# Patient Record
Sex: Female | Born: 2000 | Race: Black or African American | Hispanic: No | Marital: Single | Smoking: Never smoker
Health system: Southern US, Community
[De-identification: ages and names within clinical notes are randomized; demographics above are authoritative.]

## PROBLEM LIST (undated history)

## (undated) DIAGNOSIS — I10 Essential (primary) hypertension: Secondary | ICD-10-CM

## (undated) HISTORY — PX: HERNIA REPAIR: SHX51

---

## 2018-12-18 ENCOUNTER — Encounter (HOSPITAL_COMMUNITY): Payer: Self-pay | Admitting: Emergency Medicine

## 2018-12-18 ENCOUNTER — Emergency Department (HOSPITAL_COMMUNITY)
Admission: EM | Admit: 2018-12-18 | Discharge: 2018-12-18 | Disposition: A | Discharge status: Home / Self Care | Payer: Managed Care, Other (non HMO) | Attending: Emergency Medicine | Admitting: Emergency Medicine

## 2018-12-18 DIAGNOSIS — L509 Urticaria, unspecified: Secondary | ICD-10-CM | POA: Insufficient documentation

## 2018-12-18 DIAGNOSIS — I10 Essential (primary) hypertension: Secondary | ICD-10-CM | POA: Insufficient documentation

## 2018-12-18 HISTORY — DX: Essential (primary) hypertension: I10

## 2018-12-18 MED ORDER — DIPHENHYDRAMINE HCL 25 MG PO CAPS
25.0000 mg | ORAL_CAPSULE | Freq: Once | ORAL | Status: AC
  Administered 2018-12-18: 25 mg via ORAL
  Filled 2018-12-18: qty 1

## 2018-12-18 NOTE — Discharge Instructions (Addendum)
Take Benadryl 1 tablet every 8 hours for 3 days.

## 2018-12-18 NOTE — ED Triage Notes (Signed)
Pt presents with hives to R arm, present since earlier today. States she was at work @ [employer] when she felt her arm start to itch. Almost looks like bug bites, present on R wrist and R upper arm. States she took Benadryl around noon. Pt in NAD.

## 2018-12-18 NOTE — ED Provider Notes (Signed)
[facility] EMERGENCY DEPARTMENT Provider Note   CSN: [id number] Arrival date & time: 12/18/18  1945    History   Chief Complaint Chief Complaint  Patient presents with  . Urticaria    HPI [name] is a 18 y.o. female.     Patient presents to the ED with a chief complaint of rash.  She states that she noticed a couple of bumps on her right arm earlier this afternoon while at work.  She denies seeing anything bite her.  She took benadryl with good relief of her symptoms.  Denies any difficulty swallowing or breathing.  Denies any n/v/d.  Denies any other associated symptoms.  The history is provided by the patient. No language interpreter was used.    Past Medical History:  Diagnosis Date  . Hypertension     There are no active problems to display for this patient.   Past Surgical History:  Procedure Laterality Date  . HERNIA REPAIR       OB History   No obstetric history on file.      Home Medications    Prior to Admission medications   Not on File    Family History No family history on file.  Social History Social History   Tobacco Use  . Smoking status: Never Smoker  . Smokeless tobacco: Never Used  Substance Use Topics  . Alcohol use: Not Currently  . Drug use: Not Currently     Allergies   Patient has no known allergies.   Review of Systems Review of Systems  All other systems reviewed and are negative.    Physical Exam Updated Vital Signs BP (!) 133/94 (BP Location: Left Arm)   Pulse 92   Temp 98.6 F (37 C) (Oral)   Resp 15   Ht 5\' 1"  (1.549 m)   Wt 61.2 kg   SpO2 99%   BMI 25.51 kg/m   Physical Exam Vitals signs and nursing note reviewed.  Constitutional:      General: She is not in acute distress.    Appearance: She is not diaphoretic.  HENT:     Head: Normocephalic and atraumatic.  Eyes:     Conjunctiva/sclera: Conjunctivae normal.     Pupils: Pupils are equal, round, and reactive to  light.  Neck:     Trachea: No tracheal deviation.  Cardiovascular:     Rate and Rhythm: Normal rate.  Pulmonary:     Effort: Pulmonary effort is normal. No respiratory distress.     Comments: CTAB Abdominal:     Palpations: Abdomen is soft.  Musculoskeletal: Normal range of motion.  Skin:    General: Skin is warm and dry.     Comments: Minor hives on right upper extremity  Neurological:     Mental Status: She is alert and oriented to person, place, and time.  Psychiatric:        Judgment: Judgment normal.      ED Treatments / Results  Labs (all labs ordered are listed, but only abnormal results are displayed) Labs Reviewed - No data to display  EKG None  Radiology No results found.  Procedures Procedures (including critical care time)  Medications Ordered in ED Medications  diphenhydrAMINE (BENADRYL) capsule 25 mg (has no administration in time range)     Initial Impression / Assessment and Plan / ED Course  I have reviewed the triage vital signs and the nursing notes.  Pertinent labs & imaging results that were available during my  care of the patient were reviewed by me and considered in my medical decision making (see chart for details).        Patient with minor hives.  VSS.  NAD.  Continue benadryl.  Return precautions given.  Final Clinical Impressions(s) / ED Diagnoses   Final diagnoses:  Hives    ED Discharge Orders    None       Roxy Horseman, PA-C 12/18/18 2331    Ward, Layla Maw, DO 12/18/18 2340

## 2018-12-18 NOTE — ED Notes (Signed)
ED Provider at bedside. 

## 2020-05-03 DIAGNOSIS — I10 Essential (primary) hypertension: Secondary | ICD-10-CM | POA: Insufficient documentation

## 2020-05-03 DIAGNOSIS — R072 Precordial pain: Secondary | ICD-10-CM | POA: Diagnosis not present

## 2020-05-04 ENCOUNTER — Emergency Department (HOSPITAL_COMMUNITY): Payer: Managed Care, Other (non HMO)

## 2020-05-04 ENCOUNTER — Other Ambulatory Visit: Payer: Self-pay

## 2020-05-04 ENCOUNTER — Emergency Department (HOSPITAL_COMMUNITY)
Admission: EM | Admit: 2020-05-04 | Discharge: 2020-05-04 | Disposition: A | Discharge status: Home / Self Care | Payer: Managed Care, Other (non HMO) | Attending: Emergency Medicine | Admitting: Emergency Medicine

## 2020-05-04 ENCOUNTER — Encounter (HOSPITAL_COMMUNITY): Payer: Self-pay

## 2020-05-04 DIAGNOSIS — R0789 Other chest pain: Secondary | ICD-10-CM

## 2020-05-04 DIAGNOSIS — R072 Precordial pain: Secondary | ICD-10-CM | POA: Diagnosis not present

## 2020-05-04 LAB — BASIC METABOLIC PANEL
Anion gap: 9 (ref 5–15)
BUN: 9 mg/dL (ref 6–20)
CO2: 24 mmol/L (ref 22–32)
Calcium: 9.1 mg/dL (ref 8.9–10.3)
Chloride: 108 mmol/L (ref 98–111)
Creatinine, Ser: 0.81 mg/dL (ref 0.44–1.00)
GFR calc Af Amer: >60 mL/min (ref >60)
GFR calc non Af Amer: >60 mL/min (ref >60)
Glucose, Bld: 99 mg/dL (ref 70–99)
Potassium: 3.7 mmol/L (ref 3.5–5.1)
Sodium: 141 mmol/L (ref 135–145)

## 2020-05-04 LAB — CBC
HCT: 40.3 % (ref 36.0–46.0)
Hemoglobin: 14.1 g/dL (ref 12.0–15.0)
MCH: 31.2 pg (ref 26.0–34.0)
MCHC: 35 g/dL (ref 30.0–36.0)
MCV: 89.2 fL (ref 80.0–100.0)
Platelets: 232 10*3/uL (ref 150–400)
RBC: 4.52 MIL/uL (ref 3.87–5.11)
RDW: 12.1 % (ref 11.5–15.5)
WBC: 8.3 10*3/uL (ref 4.0–10.5)
nRBC: 0 % (ref 0.0–0.2)

## 2020-05-04 LAB — TROPONIN I (HIGH SENSITIVITY)
Troponin I (High Sensitivity): <2 ng/L (ref <18)
Troponin I (High Sensitivity): <2 ng/L (ref <18)

## 2020-05-04 LAB — I-STAT BETA HCG BLOOD, ED (NOT ORDERABLE): I-stat hCG, quantitative: <5 m[IU]/mL (ref <5)

## 2020-05-04 LAB — D-DIMER, QUANTITATIVE: D-Dimer, Quant: 0.45 ug/mL-FEU (ref 0.00–0.50)

## 2020-05-04 MED ORDER — NAPROXEN 375 MG PO TABS: 375.0000 mg | ORAL_TABLET | Freq: Two times a day (BID) | ORAL | 0 refills | Status: DC

## 2020-05-04 MED ORDER — CYCLOBENZAPRINE HCL 10 MG PO TABS: 10.0000 mg | ORAL_TABLET | Freq: Two times a day (BID) | ORAL | 0 refills | Status: DC | PRN

## 2020-05-04 MED ORDER — KETOROLAC TROMETHAMINE 15 MG/ML IJ SOLN
15.0000 mg | Freq: Once | INTRAMUSCULAR | Status: AC
  Administered 2020-05-04: 15 mg via INTRAVENOUS
  Filled 2020-05-04: qty 1

## 2020-05-04 MED ORDER — ONDANSETRON HCL 4 MG/2ML IJ SOLN
4.0000 mg | Freq: Once | INTRAMUSCULAR | Status: AC
  Administered 2020-05-04: 4 mg via INTRAVENOUS
  Filled 2020-05-04: qty 2

## 2020-05-04 MED ORDER — MORPHINE SULFATE (PF) 4 MG/ML IV SOLN
4.0000 mg | Freq: Once | INTRAVENOUS | Status: AC
  Administered 2020-05-04: 4 mg via INTRAVENOUS
  Filled 2020-05-04: qty 1

## 2020-05-04 NOTE — ED Provider Notes (Signed)
[facility]-EMERGENCY DEPT Provider Note   CSN: [id number] Arrival date & time: 05/03/20  2356     History Chief Complaint  Patient presents with  . Chest Pain    Krystal Barton is a 19 y.o. female.  The history is provided by the patient.  Chest Pain Pain location:  R chest and substernal area Pain quality: sharp, shooting and throbbing   Pain radiates to:  R shoulder and upper back Pain severity:  Moderate Onset quality:  Gradual Duration:  12 hours Timing:  Constant Progression:  Worsening Chronicity:  New Context: breathing, movement and at rest   Relieved by:  Nothing Worsened by:  Movement and deep breathing Ineffective treatments:  Rest (tylenol) Associated symptoms: no abdominal pain, no anorexia, no cough, no diaphoresis, no fever, no headache, no lower extremity edema, no nausea, no palpitations, no shortness of breath, no vomiting and no weakness   Risk factors: birth control, hypertension and smoking   Risk factors: no coronary artery disease, no diabetes mellitus, no immobilization, not pregnant and no prior DVT/PE   Risk factors comment:  No known family hx of DVT/PE.  family history of heart problems in mother and younger sisters but not sure what      Past Medical History:  Diagnosis Date  . Hypertension     There are no problems to display for this patient.   Past Surgical History:  Procedure Laterality Date  . HERNIA REPAIR       OB History   No obstetric history on file.     No family history on file.  Social History   Tobacco Use  . Smoking status: Never Smoker  . Smokeless tobacco: Never Used  Substance Use Topics  . Alcohol use: Not Currently  . Drug use: Not Currently    Home Medications Prior to Admission medications   Not on File    Allergies    Clindamycin  Review of Systems   Review of Systems  Constitutional: Negative for diaphoresis and fever.  Respiratory: Negative for cough and  shortness of breath.   Cardiovascular: Positive for chest pain. Negative for palpitations.  Gastrointestinal: Negative for abdominal pain, anorexia, nausea and vomiting.  Neurological: Negative for weakness and headaches.  All other systems reviewed and are negative.   Physical Exam Updated Vital Signs BP (!) 131/95 (BP Location: Right Arm)   Pulse 91   Temp 98 F (36.7 C) (Oral)   Resp 12   Ht 5\' 1"  (1.549 m)   Wt 63.5 kg   LMP 04/26/2020   SpO2 100%   BMI 26.45 kg/m   Physical Exam Vitals and nursing note reviewed.  Constitutional:      General: She is not in acute distress.    Appearance: She is well-developed and normal weight.  HENT:     Head: Normocephalic and atraumatic.  Eyes:     Pupils: Pupils are equal, round, and reactive to light.  Cardiovascular:     Rate and Rhythm: Normal rate and regular rhythm.     Heart sounds: Normal heart sounds. No murmur heard.  No friction rub.  Pulmonary:     Effort: Pulmonary effort is normal.     Breath sounds: Normal breath sounds. No wheezing or rales.     Comments: Mild tenderness at the bottom of sternum but no other reproducible pain. Abdominal:     General: Bowel sounds are normal. There is no distension.     Palpations: Abdomen is soft.  Tenderness: There is no abdominal tenderness. There is no guarding or rebound.  Musculoskeletal:        General: No tenderness. Normal range of motion.     Right lower leg: No edema.     Left lower leg: No edema.     Comments: No edema  Skin:    General: Skin is warm and dry.     Capillary Refill: Capillary refill takes less than 2 seconds.     Findings: No rash.  Neurological:     General: No focal deficit present.     Mental Status: She is alert and oriented to person, place, and time. Mental status is at baseline.     Cranial Nerves: No cranial nerve deficit.  Psychiatric:        Mood and Affect: Mood normal.        Behavior: Behavior normal.        Thought Content:  Thought content normal.     ED Results / Procedures / Treatments   Labs (all labs ordered are listed, but only abnormal results are displayed) Labs Reviewed  BASIC METABOLIC PANEL  CBC  D-DIMER, QUANTITATIVE (NOT AT [facility])  I-STAT BETA HCG BLOOD, ED ([facility], [facility], [facility] ONLY)  I-STAT BETA HCG BLOOD, ED (NOT ORDERABLE)  TROPONIN I (HIGH SENSITIVITY)  TROPONIN I (HIGH SENSITIVITY)    EKG EKG Interpretation  Date/Time:  Tuesday May 04 2020 00:12:19 EDT Ventricular Rate:  91 PR Interval:    QRS Duration: 85 QT Interval:  338 QTC Calculation: 416 R Axis:   40 Text Interpretation: Sinus rhythm Probable left atrial enlargement Low voltage, precordial leads No old tracing to compare Confirmed by Devoria Albe ([id number]) on 05/04/2020 12:34:00 AM   Radiology DG Chest 2 View  Result Date: 05/04/2020 CLINICAL DATA:  Shortness of breath EXAM: CHEST - 2 VIEW COMPARISON:  None. FINDINGS: The heart size and mediastinal contours are within normal limits. Both lungs are clear. The visualized skeletal structures are unremarkable. IMPRESSION: No active cardiopulmonary disease. Electronically Signed   By: Jonna Clark M.D.   On: 05/04/2020 00:30    Procedures Procedures (including critical care time)  Medications Ordered in ED Medications  ketorolac (TORADOL) 15 MG/ML injection 15 mg (15 mg Intravenous Given 05/04/20 0753)    ED Course  I have reviewed the triage vital signs and the nursing notes.  Pertinent labs & imaging results that were available during my care of the patient were reviewed by me and considered in my medical decision making (see chart for details).    MDM Rules/Calculators/A&P                          Patient is a 19 year old female presenting with acute chest pain that started yesterday.  It is been present now for 12 hours and worse with movement and breathing.  She is a low risk Wells but does take birth control and intermittently smokes cigarettes.  Vital signs have been  stable except for mild hypertension which patient is aware of.  She has no abdominal pain but pain does radiate to the right.  She denies any Covid-like symptoms or recent URI symptoms.  She has not had a cough.  No history of asthma.  Patient does appear uncomfortable with deep breathing.  Slight decreased breath sounds on the right but chest x-ray without evidence of pneumothorax.  CBC, BMP and initial troponin are within normal limits.  hCG is negative.  D-dimer is  0.45 and within normal limits.  Patient given Toradol for pain.  Delta troponin is also neg.  Low suspicion for dissection at this time, pneumonia, myocarditis or pericarditis.  With a negative D-dimer low suspicion for PE.  Feel that patient symptoms are most likely pleuritic pain most likely firm pleurisy. 9:08 AM Pt still having pain after toradol and was given morphine.  9:36 AM  Pt feeling better after pain meds.  Will d/c home iwht return precautions.  MDM Number of Diagnoses or Management Options   Amount and/or Complexity of Data Reviewed Clinical lab tests: ordered and reviewed Tests in the radiology section of CPT: ordered and reviewed Tests in the medicine section of CPT: ordered and reviewed Decide to obtain previous medical records or to obtain history from someone other than the patient: no Obtain history from someone other than the patient: no Discuss the patient with other providers: no Independent visualization of images, tracings, or specimens: yes  Risk of Complications, Morbidity, and/or Mortality Presenting problems: moderate Diagnostic procedures: low Management options: low  Patient Progress Patient progress: improved   Final Clinical Impression(s) / ED Diagnoses Final diagnoses:  Chest wall pain    Rx / DC Orders ED Discharge Orders         Ordered    naproxen (NAPROSYN) 375 MG tablet  2 times daily     Discontinue  Reprint     05/04/20 0938    cyclobenzaprine (FLEXERIL) 10 MG tablet  2  times daily PRN     Discontinue  Reprint     05/04/20 8882           Gwyneth Sprout, MD 05/04/20 [phone]

## 2020-05-04 NOTE — Discharge Instructions (Signed)
All your blood work today was normal.  There was no evidence of strain to your heart or concerns for blood clot.  The x-ray was clear without pneumonia or collapsed lung.  You are not showing any signs of anemia and have normal electrolytes and kidney function.  It will be important to return if your symptoms worsen.  However it could take up to a week for the pain to completely go away.  Do not do any lifting with your right arm and use the medications prescribed as needed.  You can also take Tylenol as well.

## 2020-05-04 NOTE — ED Triage Notes (Signed)
Pt sts sternal cp starting at around 2200 tonight that radiates around the right breast.

## 2020-05-19 ENCOUNTER — Telehealth: Payer: Self-pay

## 2020-05-19 NOTE — Telephone Encounter (Signed)
NOTES ON FILE FROM [facility] [phone] SENT REFERRAL TO SCHEDULING

## 2020-06-20 NOTE — Progress Notes (Signed)
Cardiology Office Note   Date:  06/22/2020   ID:  Lian Pounds, DOB 2000-12-21, MRN [id number]  PCP:  Patient, No Pcp Per  Cardiologist:   Rinnah Peppel [place], MD   Chief Complaint  Patient presents with  . Chest Pain      History of Present Illness: Krystal Barton is a 19 y.o. female who is seen at the request of Dr Andrey Campanile for evaluation of chest pain. She was seen in the ED on 05/04/20 with chest pain. Ecg, CXR, serial troponin and D dimer were normal.   She reports she is still having occasional pain like before but not as severe. Her pain starts in her right shoulder to the scapula and in the mid chest radiating around the right breast. It is worse with movement of her right arm. Improved with NSAIDs and Flexeril. No dyspnea.     Past Medical History:  Diagnosis Date  . Hypertension     Past Surgical History:  Procedure Laterality Date  . HERNIA REPAIR       Current Outpatient Medications  Medication Sig Dispense Refill  . acetaminophen (TYLENOL) 325 MG tablet Take 650 mg by mouth every 6 (six) hours as needed for mild pain or headache.    Dimas Millin 1/35 tablet Take 1 tablet by mouth daily.    Marland Kitchen BIOTIN PO Take 2 tablets by mouth 2 (two) times daily.    . cyclobenzaprine (FLEXERIL) 10 MG tablet Take 1 tablet (10 mg total) by mouth 2 (two) times daily as needed for muscle spasms. 20 tablet 0  . naproxen (NAPROSYN) 375 MG tablet Take 1 tablet (375 mg total) by mouth 2 (two) times daily. 20 tablet 0  . polyvinyl alcohol (LIQUIFILM TEARS) 1.4 % ophthalmic solution Place 1 drop into both eyes as needed for dry eyes.     No current facility-administered medications for this visit.    Allergies:   Clindamycin    Social History:  The patient  reports that she has never smoked. She has never used smokeless tobacco. She reports previous alcohol use. She reports previous drug use.   Family History:  The patient's family history includes Arrhythmia in her sister;  Hypertension in her sister; Lupus in her mother.    ROS:  Please see the history of present illness.   Otherwise, review of systems are positive for none.   All other systems are reviewed and negative.    PHYSICAL EXAM: VS:  BP 106/70   Pulse 87   Ht 5' 1.5" (1.562 m)   Wt 140 lb 3.2 oz (63.6 kg)   SpO2 99%   BMI 26.06 kg/m  , BMI Body mass index is 26.06 kg/m. GEN: Well nourished, well developed, in no acute distress  HEENT: normal  Neck: no JVD, carotid bruits, or masses Cardiac: RRR; no murmurs, rubs, or gallops,no edema  Respiratory:  clear to auscultation bilaterally, normal work of breathing GI: soft, nontender, nondistended, + BS MS: no deformity or atrophy  Skin: warm and dry, no rash Neuro:  Strength and sensation are intact Psych: euthymic mood, full affect   EKG:  EKG is ordered today. The ekg ordered today demonstrates NSR rate 87. Normal. I have personally reviewed and interpreted this study.    Recent Labs: 05/04/2020: BUN 9; Creatinine, Ser 0.81; Hemoglobin 14.1; Platelets 232; Potassium 3.7; Sodium 141    Lipid Panel No results found for: CHOL, TRIG, HDL, CHOLHDL, VLDL, LDLCALC, LDLDIRECT    Wt Readings from Last 3  Encounters:  06/22/20 140 lb 3.2 oz (63.6 kg)  05/04/20 140 lb (63.5 kg)  12/18/18 135 lb (61.2 kg) (64 %, Z= 0.35)*   * Growth percentiles are based on CDC (Girls, 2-20 Years) data.      Other studies Reviewed: Additional studies/ records that were reviewed today include: none    ASSESSMENT AND PLAN:  1.  Musculoskeletal chest pain. Very atypical for any cardiac cause. Normal Exam and Ecg. I have reassured her. Recommend continued use of Naproxen prn. Follow up PRN.   Signed, Katrianna Friesenhahn [place], MD  06/22/2020 10:33 AM    [facility] [address], [place], [place], [address] Phone [phone], Fax [phone]

## 2020-06-22 ENCOUNTER — Other Ambulatory Visit: Payer: Self-pay

## 2020-06-22 ENCOUNTER — Encounter: Payer: Self-pay | Admitting: Cardiology

## 2020-06-22 ENCOUNTER — Ambulatory Visit (INDEPENDENT_AMBULATORY_CARE_PROVIDER_SITE_OTHER): Payer: 59 | Admitting: Cardiology

## 2020-06-22 VITALS — BP 106/70 | HR 87 | Ht 61.5 in | Wt 140.2 lb

## 2020-06-22 DIAGNOSIS — R0789 Other chest pain: Secondary | ICD-10-CM

## 2020-09-24 ENCOUNTER — Encounter (HOSPITAL_COMMUNITY): Payer: Self-pay

## 2020-09-24 ENCOUNTER — Ambulatory Visit (HOSPITAL_COMMUNITY)
Admission: EM | Admit: 2020-09-24 | Discharge: 2020-09-24 | Disposition: A | Discharge status: Home / Self Care | Payer: 59 | Attending: Emergency Medicine | Admitting: Emergency Medicine

## 2020-09-24 ENCOUNTER — Other Ambulatory Visit: Payer: Self-pay

## 2020-09-24 DIAGNOSIS — J069 Acute upper respiratory infection, unspecified: Secondary | ICD-10-CM | POA: Insufficient documentation

## 2020-09-24 DIAGNOSIS — Z20822 Contact with and (suspected) exposure to covid-19: Secondary | ICD-10-CM | POA: Insufficient documentation

## 2020-09-24 LAB — RESP PANEL BY RT-PCR (FLU A&B, COVID) ARPGX2
Influenza A by PCR: NEGATIVE
Influenza B by PCR: NEGATIVE
SARS Coronavirus 2 by RT PCR: NEGATIVE

## 2020-09-24 NOTE — ED Triage Notes (Signed)
Pt in with c/o ST, productive cough, runny nose and headache that have been going on for 2 days now  Pt tried thera flu with minimal relief  Denies nausea, vomiting, diarrhea

## 2020-09-24 NOTE — Discharge Instructions (Addendum)
Your COVID and Flu tests are pending.  You should self quarantine until the test results are back.    Take Tylenol or ibuprofen as needed for fever or discomfort.  Rest and keep yourself hydrated.    Follow-up with your primary care provider if your symptoms are not improving.     

## 2020-09-24 NOTE — ED Provider Notes (Signed)
[facility]    CSN: [id number] Arrival date & time: 09/24/20  1348      History   Chief Complaint Chief Complaint  Patient presents with   Cough   Headache   Nasal Congestion   Sore Throat    HPI Krystal Barton is a 19 y.o. female.   Patient presents with 2-day history of runny nose, nasal congestion, sore throat, cough.  She denies fever, chills, rash, shortness of breath, vomiting, diarrhea, or other symptoms.  Treatment attempted at home with TheraFlu.  Her medical history includes hypertension.  The history is provided by the patient and medical records.    Past Medical History:  Diagnosis Date   Hypertension     There are no problems to display for this patient.   Past Surgical History:  Procedure Laterality Date   HERNIA REPAIR      OB History   No obstetric history on file.      Home Medications    Prior to Admission medications   Medication Sig Start Date End Date Taking? Authorizing Provider  norethindrone-ethinyl estradiol (CYCLAFEM) 0.5/0.75/1-35 MG-MCG tablet Take 1 tablet by mouth daily. 06/29/20 06/29/21 Yes [provider]  acetaminophen (TYLENOL) 325 MG tablet Take 650 mg by mouth every 6 (six) hours as needed for mild pain or headache.    [provider]  Dimas Millin 1/35 tablet Take 1 tablet by mouth daily. 04/19/20   [provider]  BIOTIN PO Take 2 tablets by mouth 2 (two) times daily.    [provider]  cyclobenzaprine (FLEXERIL) 10 MG tablet Take 1 tablet (10 mg total) by mouth 2 (two) times daily as needed for muscle spasms. 05/04/20   Gwyneth Sprout, MD  naproxen (NAPROSYN) 375 MG tablet Take 1 tablet (375 mg total) by mouth 2 (two) times daily. 05/04/20   Gwyneth Sprout, MD  polyvinyl alcohol (LIQUIFILM TEARS) 1.4 % ophthalmic solution Place 1 drop into both eyes as needed for dry eyes.    [provider]    Family History Family History  Problem Relation Age of Onset    Lupus Mother    Hypertension Sister    Arrhythmia Sister     Social History Social History   Tobacco Use   Smoking status: Never Smoker   Smokeless tobacco: Never Used  Substance Use Topics   Alcohol use: Not Currently   Drug use: Not Currently     Allergies   Clindamycin   Review of Systems Review of Systems  Constitutional: Negative for chills and fever.  HENT: Positive for congestion, rhinorrhea and sore throat. Negative for ear pain.   Eyes: Negative for pain and visual disturbance.  Respiratory: Positive for cough. Negative for shortness of breath.   Cardiovascular: Negative for chest pain and palpitations.  Gastrointestinal: Negative for abdominal pain, diarrhea and vomiting.  Genitourinary: Negative for dysuria and hematuria.  Musculoskeletal: Negative for arthralgias and back pain.  Skin: Negative for color change and rash.  Neurological: Negative for seizures and syncope.  All other systems reviewed and are negative.    Physical Exam Triage Vital Signs ED Triage Vitals  Enc Vitals Group     BP 09/24/20 1417 133/76     Pulse Rate 09/24/20 1417 88     Resp 09/24/20 1417 17     Temp 09/24/20 1417 98 F (36.7 C)     Temp Source 09/24/20 1417 Oral     SpO2 09/24/20 1417 99 %     Weight --  Height --      Head Circumference --      Peak Flow --      Pain Score 09/24/20 1415 6     Pain Loc --      Pain Edu? --      Excl. in GC? --    No data found.  Updated Vital Signs BP 133/76 (BP Location: Right Arm)    Pulse 88    Temp 98 F (36.7 C) (Oral)    Resp 17    LMP 09/17/2020 (Approximate)    SpO2 99%   Visual Acuity Right Eye Distance:   Left Eye Distance:   Bilateral Distance:    Right Eye Near:   Left Eye Near:    Bilateral Near:     Physical Exam Vitals and nursing note reviewed.  Constitutional:      General: She is not in acute distress.    Appearance: She is well-developed and well-nourished. She is not ill-appearing.  HENT:      Head: Normocephalic and atraumatic.     Right Ear: Tympanic membrane normal.     Left Ear: Tympanic membrane normal.     Nose: Congestion and rhinorrhea present.     Mouth/Throat:     Mouth: Mucous membranes are moist.     Pharynx: Oropharynx is clear.  Eyes:     Conjunctiva/sclera: Conjunctivae normal.  Cardiovascular:     Rate and Rhythm: Normal rate and regular rhythm.     Heart sounds: Normal heart sounds.  Pulmonary:     Effort: Pulmonary effort is normal. No respiratory distress.     Breath sounds: Normal breath sounds.  Abdominal:     Palpations: Abdomen is soft.     Tenderness: There is no abdominal tenderness. There is no guarding.  Musculoskeletal:        General: No edema.     Cervical back: Neck supple.  Skin:    General: Skin is warm and dry.     Findings: No rash.  Neurological:     General: No focal deficit present.     Mental Status: She is alert and oriented to person, place, and time.     Gait: Gait normal.  Psychiatric:        Mood and Affect: Mood and affect and mood normal.        Behavior: Behavior normal.      UC Treatments / Results  Labs (all labs ordered are listed, but only abnormal results are displayed) Labs Reviewed  RESP PANEL BY RT-PCR (FLU A&B, COVID) [id number]    EKG   Radiology No results found.  Procedures Procedures (including critical care time)  Medications Ordered in UC Medications - No data to display  Initial Impression / Assessment and Plan / UC Course  I have reviewed the triage vital signs and the nursing notes.  Pertinent labs & imaging results that were available during my care of the patient were reviewed by me and considered in my medical decision making (see chart for details).   Viral URI.  Influenza and COVID pending.  Instructed patient to self quarantine until the test results are back.  Discussed symptomatic treatment including Tylenol, rest, hydration.  Instructed patient to follow up with PCP if her  symptoms are not improving  Patient agrees to plan of care.    Final Clinical Impressions(s) / UC Diagnoses   Final diagnoses:  Viral upper respiratory tract infection     Discharge Instructions  Your COVID and Flu tests are pending.  You should self quarantine until the test results are back.    Take Tylenol or ibuprofen as needed for fever or discomfort.  Rest and keep yourself hydrated.    Follow-up with your primary care provider if your symptoms are not improving.        ED Prescriptions    None     PDMP not reviewed this encounter.   Mickie Bail, NP 09/24/20 1440

## 2020-10-28 ENCOUNTER — Encounter (HOSPITAL_COMMUNITY): Payer: Self-pay | Admitting: Emergency Medicine

## 2020-10-28 ENCOUNTER — Emergency Department (HOSPITAL_COMMUNITY)
Admission: EM | Admit: 2020-10-28 | Discharge: 2020-10-29 | Disposition: A | Discharge status: Home / Self Care | Payer: 59 | Attending: Emergency Medicine | Admitting: Emergency Medicine

## 2020-10-28 ENCOUNTER — Other Ambulatory Visit: Payer: Self-pay

## 2020-10-28 DIAGNOSIS — R112 Nausea with vomiting, unspecified: Secondary | ICD-10-CM | POA: Insufficient documentation

## 2020-10-28 DIAGNOSIS — M545 Low back pain, unspecified: Secondary | ICD-10-CM | POA: Diagnosis present

## 2020-10-28 DIAGNOSIS — M25552 Pain in left hip: Secondary | ICD-10-CM | POA: Diagnosis not present

## 2020-10-28 DIAGNOSIS — I10 Essential (primary) hypertension: Secondary | ICD-10-CM | POA: Insufficient documentation

## 2020-10-28 LAB — URINALYSIS, ROUTINE W REFLEX MICROSCOPIC
Bacteria, UA: NONE SEEN
Bilirubin Urine: NEGATIVE
Glucose, UA: NEGATIVE mg/dL
Hgb urine dipstick: NEGATIVE
Ketones, ur: NEGATIVE mg/dL
Leukocytes,Ua: NEGATIVE
Nitrite: NEGATIVE
Protein, ur: NEGATIVE mg/dL
Specific Gravity, Urine: 1.008 (ref 1.005–1.030)
pH: 6 (ref 5.0–8.0)

## 2020-10-28 LAB — CBC
HCT: 41.7 % (ref 36.0–46.0)
Hemoglobin: 14.7 g/dL (ref 12.0–15.0)
MCH: 31.3 pg (ref 26.0–34.0)
MCHC: 35.3 g/dL (ref 30.0–36.0)
MCV: 88.9 fL (ref 80.0–100.0)
Platelets: 214 10*3/uL (ref 150–400)
RBC: 4.69 MIL/uL (ref 3.87–5.11)
RDW: 12.1 % (ref 11.5–15.5)
WBC: 5.9 10*3/uL (ref 4.0–10.5)
nRBC: 0 % (ref 0.0–0.2)

## 2020-10-28 LAB — COMPREHENSIVE METABOLIC PANEL
ALT: 16 U/L (ref 0–44)
AST: 19 U/L (ref 15–41)
Albumin: 4.3 g/dL (ref 3.5–5.0)
Alkaline Phosphatase: 45 U/L (ref 38–126)
Anion gap: 10 (ref 5–15)
BUN: 6 mg/dL (ref 6–20)
CO2: 22 mmol/L (ref 22–32)
Calcium: 9.1 mg/dL (ref 8.9–10.3)
Chloride: 104 mmol/L (ref 98–111)
Creatinine, Ser: 0.71 mg/dL (ref 0.44–1.00)
GFR, Estimated: >60 mL/min (ref >60)
Glucose, Bld: 88 mg/dL (ref 70–99)
Potassium: 3.6 mmol/L (ref 3.5–5.1)
Sodium: 136 mmol/L (ref 135–145)
Total Bilirubin: 0.9 mg/dL (ref 0.3–1.2)
Total Protein: 7 g/dL (ref 6.5–8.1)

## 2020-10-28 LAB — I-STAT BETA HCG BLOOD, ED (MC, WL, AP ONLY): I-stat hCG, quantitative: <5 m[IU]/mL (ref <5)

## 2020-10-28 LAB — LIPASE, BLOOD: Lipase: 21 U/L (ref 11–51)

## 2020-10-28 NOTE — ED Triage Notes (Signed)
Pt c/o worsening LLQ pain that started yesterday. States she has been feeling nauseous since the start of her period, 1 week ago. Denies urinary symptoms or abnormal vaginal bleeding/discharge.

## 2020-10-29 ENCOUNTER — Encounter (HOSPITAL_COMMUNITY): Payer: Self-pay | Admitting: Student

## 2020-10-29 ENCOUNTER — Emergency Department (HOSPITAL_COMMUNITY): Payer: 59

## 2020-10-29 DIAGNOSIS — M545 Low back pain, unspecified: Secondary | ICD-10-CM | POA: Diagnosis not present

## 2020-10-29 LAB — WET PREP, GENITAL
Sperm: NONE SEEN
Trich, Wet Prep: NONE SEEN
Yeast Wet Prep HPF POC: NONE SEEN

## 2020-10-29 LAB — GC/CHLAMYDIA PROBE AMP (~~LOC~~) NOT AT ARMC
Chlamydia: NEGATIVE
Comment: NEGATIVE
Comment: NORMAL
Neisseria Gonorrhea: NEGATIVE

## 2020-10-29 MED ORDER — NAPROXEN 500 MG PO TABS: 500.0000 mg | ORAL_TABLET | Freq: Two times a day (BID) | ORAL | 0 refills | Status: AC | PRN

## 2020-10-29 MED ORDER — METHOCARBAMOL 500 MG PO TABS: 500.0000 mg | ORAL_TABLET | Freq: Three times a day (TID) | ORAL | 0 refills | Status: AC | PRN

## 2020-10-29 MED ORDER — NAPROXEN 250 MG PO TABS
500.0000 mg | ORAL_TABLET | Freq: Once | ORAL | Status: AC
  Administered 2020-10-29: 500 mg via ORAL
  Filled 2020-10-29: qty 2

## 2020-10-29 MED ORDER — ONDANSETRON 4 MG PO TBDP: 4.0000 mg | ORAL_TABLET | Freq: Three times a day (TID) | ORAL | 0 refills | Status: AC | PRN

## 2020-10-29 NOTE — ED Notes (Signed)
Pelvic cart to bedside 

## 2020-10-29 NOTE — ED Provider Notes (Signed)
[facility] EMERGENCY DEPARTMENT Provider Note   CSN: [id number] Arrival date & time: 10/28/20  1753     History Chief Complaint  Patient presents with  . Hip Pain    Krystal Barton is a 20 y.o. female with a hx of hypertension who presents to the ED with complaints of left side pain that began a few days prior. Pain is in the left side/hip/back, progressively worsening, currently an 8/10 in severity. No alleviating/aggravating factors. Has associated nausea. Last menses started about 1 week ago, finished Sunday, had N/V with period which is not necessarily typical of her. Currently sexually active in a monogamous relationship. Takes OCP but does not take consistently. Denies fever, chills, hematemesis, dysuria, frequency, diarrhea, or vaginal discharge. No recent trauma/injuries.   HPI     Past Medical History:  Diagnosis Date  . Hypertension     There are no problems to display for this patient.   Past Surgical History:  Procedure Laterality Date  . HERNIA REPAIR       OB History   No obstetric history on file.     Family History  Problem Relation Age of Onset  . Lupus Mother   . Hypertension Sister   . Arrhythmia Sister     Social History   Tobacco Use  . Smoking status: Never Smoker  . Smokeless tobacco: Never Used  Substance Use Topics  . Alcohol use: Not Currently  . Drug use: Not Currently    Home Medications Prior to Admission medications   Medication Sig Start Date End Date Taking? Authorizing Provider  acetaminophen (TYLENOL) 325 MG tablet Take 650 mg by mouth every 6 (six) hours as needed for mild pain or headache.    [provider]  Dimas Millin 1/35 tablet Take 1 tablet by mouth daily. 04/19/20   [provider]  BIOTIN PO Take 2 tablets by mouth 2 (two) times daily.    [provider]  cyclobenzaprine (FLEXERIL) 10 MG tablet Take 1 tablet (10 mg total) by mouth 2 (two) times daily as needed for muscle  spasms. 05/04/20   Gwyneth Sprout, MD  naproxen (NAPROSYN) 375 MG tablet Take 1 tablet (375 mg total) by mouth 2 (two) times daily. 05/04/20   Gwyneth Sprout, MD  norethindrone-ethinyl estradiol (CYCLAFEM) 0.5/0.75/1-35 MG-MCG tablet Take 1 tablet by mouth daily. 06/29/20 06/29/21  [provider]  polyvinyl alcohol (LIQUIFILM TEARS) 1.4 % ophthalmic solution Place 1 drop into both eyes as needed for dry eyes.    [provider]    Allergies    Clindamycin  Review of Systems   Review of Systems  Constitutional: Negative for chills and fever.  Respiratory: Negative for shortness of breath.   Cardiovascular: Negative for chest pain.  Gastrointestinal: Positive for abdominal pain, nausea and vomiting.  Genitourinary: Positive for vaginal bleeding (resolved). Negative for dysuria, frequency and vaginal discharge.  Neurological: Negative for syncope.  All other systems reviewed and are negative.   Physical Exam Updated Vital Signs BP 126/70   Pulse 80   Temp 98.2 F (36.8 C) (Oral)   Resp 17   LMP 10/24/2020   SpO2 98%   Physical Exam Vitals and nursing note reviewed. Exam conducted with a chaperone present.  Constitutional:      General: She is not in acute distress.    Appearance: She is well-developed. She is not toxic-appearing.  HENT:     Head: Normocephalic and atraumatic.  Eyes:     General:  Right eye: No discharge.        Left eye: No discharge.     Conjunctiva/sclera: Conjunctivae normal.  Cardiovascular:     Rate and Rhythm: Normal rate and regular rhythm.     Comments: 2+ symmetric DP/PT pulses bilaterally. Pulmonary:     Effort: Pulmonary effort is normal. No respiratory distress.     Breath sounds: Normal breath sounds. No wheezing, rhonchi or rales.  Abdominal:     General: There is no distension.     Palpations: Abdomen is soft.     Tenderness: There is no abdominal tenderness. There is no guarding or rebound.  Genitourinary:     Labia:        Right: No lesion.        Left: No lesion.      Cervix: No cervical motion tenderness.     Adnexa:        Right: No mass, tenderness or fullness.         Left: No mass, tenderness or fullness.       Comments: Minimal clear to thin white discharge present.  Musculoskeletal:     Cervical back: Neck supple.     Comments: No obvious deformities, rashes, or swelling.  Lower extremities: intact AROM throughout the major joints. Tender to palpation to the left hip laterally specifically at iliac crest.  Back: Left lumbar paraspinal muscle tenderness to palpation. No midline tenderness.   Skin:    General: Skin is warm and dry.     Findings: No rash.  Neurological:     Mental Status: She is alert.     Comments: Clear speech.   Psychiatric:        Behavior: Behavior normal.     ED Results / Procedures / Treatments   Labs (all labs ordered are listed, but only abnormal results are displayed) Labs Reviewed  WET PREP, GENITAL - Abnormal; Notable for the following components:      Result Value   Clue Cells Wet Prep HPF POC PRESENT (*)    WBC, Wet Prep HPF POC MANY (*)    All other components within normal limits  URINALYSIS, ROUTINE W REFLEX MICROSCOPIC - Abnormal; Notable for the following components:   Color, Urine STRAW (*)    All other components within normal limits  LIPASE, BLOOD  COMPREHENSIVE METABOLIC PANEL  CBC  I-STAT BETA HCG BLOOD, ED ([facility], [facility], [facility] ONLY)  GC/CHLAMYDIA PROBE AMP ([place]) NOT AT [facility]    EKG None  Radiology DG Hip Unilat W or Wo Pelvis 2-3 Views Left  Result Date: 10/29/2020 CLINICAL DATA:  Left hip pain. EXAM: DG HIP (WITH OR WITHOUT PELVIS) 2-3V LEFT COMPARISON:  None. FINDINGS: There is no evidence of hip fracture or dislocation. There is no evidence of arthropathy or other focal bone abnormality. IMPRESSION: Negative. Electronically Signed   By: Katherine Mantle M.D.   On: 10/29/2020 05:03    Procedures Procedures (including  critical care time)  Medications Ordered in ED Medications  naproxen (NAPROSYN) tablet 500 mg (500 mg Oral Given 10/29/20 0630)    ED Course  I have reviewed the triage vital signs and the nursing notes.  Pertinent labs & imaging results that were available during my care of the patient were reviewed by me and considered in my medical decision making (see chart for details).    MDM Rules/Calculators/A&P  Patient presents to the ED with complaints of left side pain.  Nontoxic, vitals without significant abnormality.   Additional history obtained:  Additional history obtained from chart review & nursing note review.   Lab Tests:  I Ordered, reviewed, and interpreted labs, which included:  CBC/CMP/lipase/preg test- unremarkable UA: No UTI Pregnancy test negative therefore doubt ectopic.  Wet prep: clue cells, no new vaginal discharge/odor per patient or tenderness throughout bimanual do not feel she requires tx for BV. No trich/yeast. WBC present, no concern for STI by patient states she had recent STD testing at [facility] and had a reassuring annual check up. GC/chlamydia pending from today- doubt PID.   Patient's abdomen is nontender to palpation without peritoneal signs and she does not have tenderness on bimanual exam- doubt acute surgical process, low suspicion for ovarian torsion, perforation, obstruction, PID, or ectopic. No CVA tenderness, ua without blood or infection- doubt nephrolithiasis/pyelonephritis. Unclear definitive etiology however seems possibly muscular. Will trial naproxen/robaxin PCP follow up. Tolerating PO here, did have some nausea earlier, will provide zofran as well. I discussed results, treatment plan, need for follow-up, and return precautions with the patient. Provided opportunity for questions, patient confirmed understanding and is in agreement with plan.   Portions of this note were generated with Scientist, clinical (histocompatibility and immunogenetics). Dictation  errors may occur despite best attempts at proofreading.  Final Clinical Impression(s) / ED Diagnoses Final diagnoses:  Acute left-sided low back pain without sciatica  Left hip pain    Rx / DC Orders ED Discharge Orders         Ordered    naproxen (NAPROSYN) 500 MG tablet  2 times daily PRN        10/29/20 0646    methocarbamol (ROBAXIN) 500 MG tablet  Every 8 hours PRN        10/29/20 0646    ondansetron (ZOFRAN ODT) 4 MG disintegrating tablet  Every 8 hours PRN        10/29/20 0650           Cherly Anderson, PA-C 10/29/20 0722    Gilda Crease, MD 10/30/20 [phone]

## 2020-10-29 NOTE — Discharge Instructions (Signed)
You are seen in the emergency department today for back/hip pain.  Your labs are overall reassuring.  We are unsure of the exact cause of your pain but feel that it may be from a muscular issue.  We are sending you home with following medicines  - Naproxen is a nonsteroidal anti-inflammatory medication that will help with pain and swelling. Be sure to take this medication as prescribed with food, 1 pill every 12 hours,  It should be taken with food, as it can cause stomach upset, and more seriously, stomach bleeding. Do not take other nonsteroidal anti-inflammatory medications with this such as Advil, Motrin, Aleve, Mobic, Goodie Powder, or Motrin.    - Robaxin is the muscle relaxer I have prescribed, this is meant to help with muscle tightness. Be aware that this medication may make you drowsy therefore the first time you take this it should be at a time you are in an environment where you can rest. Do not drive or operate heavy machinery when taking this medication. Do not drink alcohol or take other sedating medications with this medicine such as narcotics or benzodiazepines.   - Zofran- take every 8 hours as needed for nausea/vomiting.   You make take Tylenol per over the counter dosing with these medications.   We have prescribed you new medication(s) today. Discuss the medications prescribed today with your pharmacist as they can have adverse effects and interactions with your other medicines including over the counter and prescribed medications. Seek medical evaluation if you start to experience new or abnormal symptoms after taking one of these medicines, seek care immediately if you start to experience difficulty breathing, feeling of your throat closing, facial swelling, or rash as these could be indications of a more serious allergic reaction

## 2021-04-04 IMAGING — DX DG HIP (WITH OR WITHOUT PELVIS) 2-3V*L*
3 series · 3 of 3 positions shown · non-contrast
Comparison: None.

CLINICAL DATA: Left hip pain.

EXAM:
DG HIP (WITH OR WITHOUT PELVIS) 2-3V LEFT

[pelvis ap]
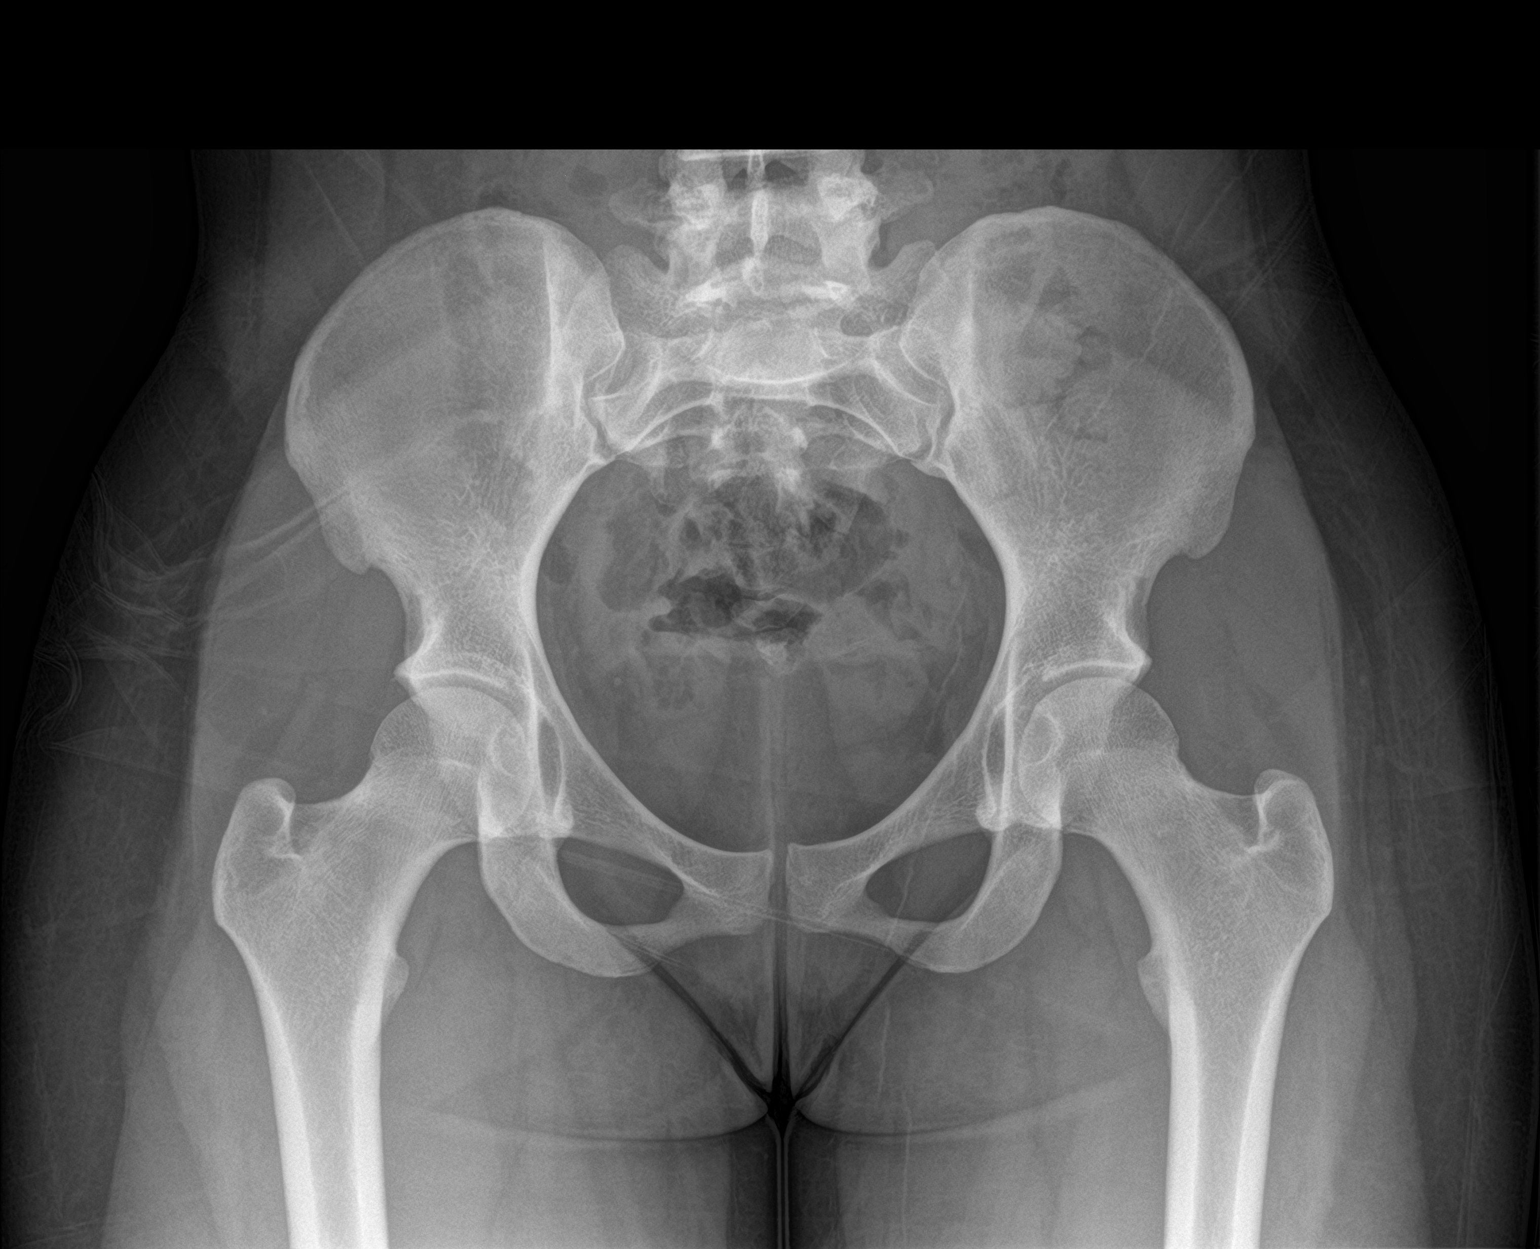

[hip ap]
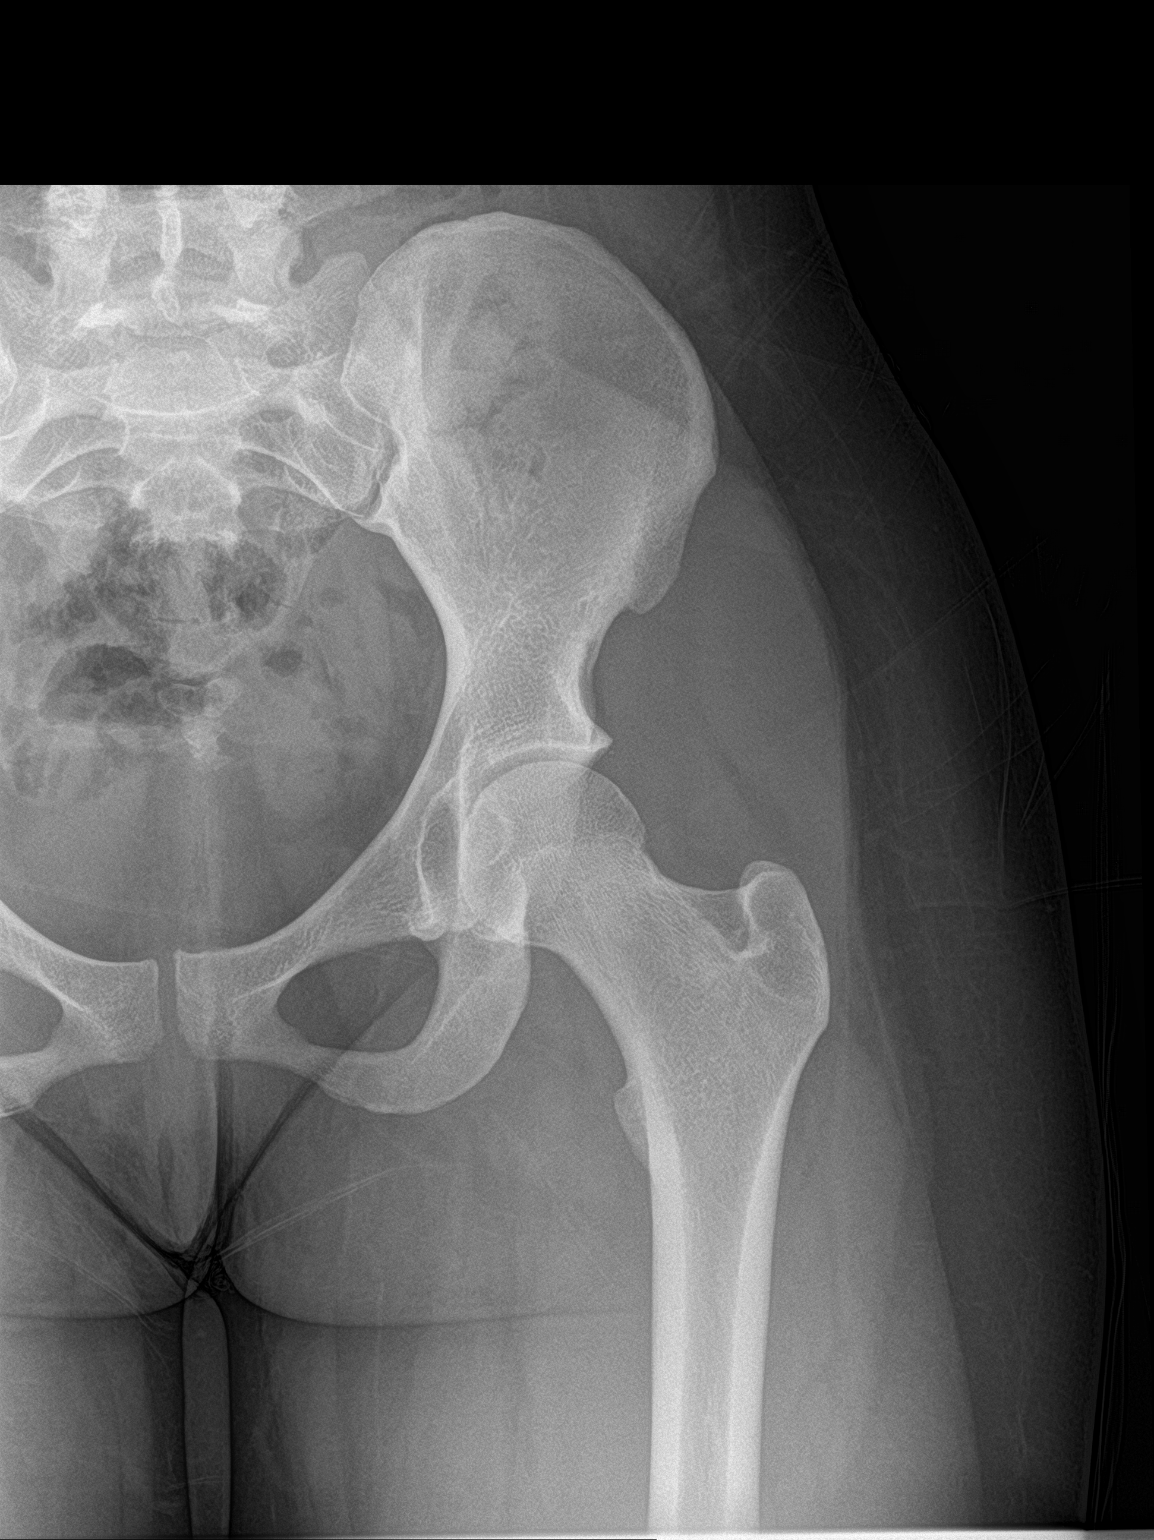

[hip lat]
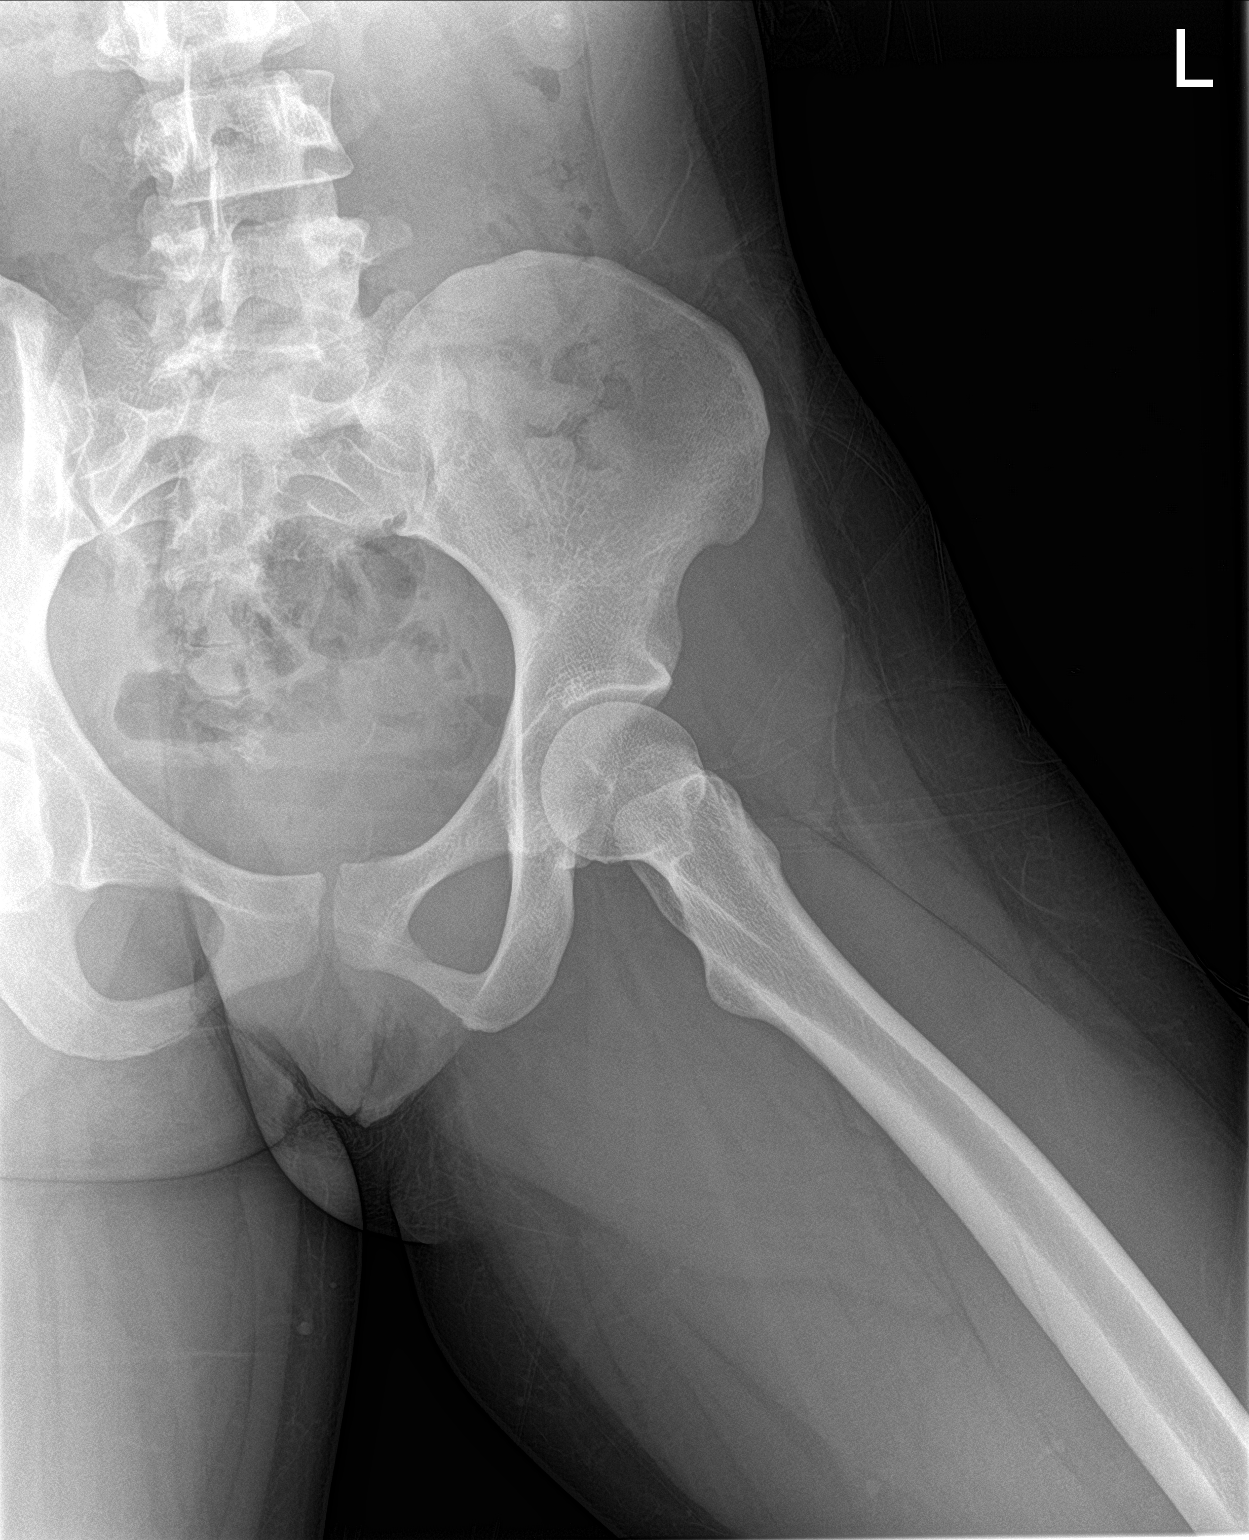

[3 of 3 positions shown; findings below may reference images not displayed]

FINDINGS: There is no evidence of hip fracture or dislocation. There is no
evidence of arthropathy or other focal bone abnormality.
IMPRESSION: Negative.
